# Patient Record
Sex: Male | Born: 1980 | Race: White | Hispanic: No | Marital: Single
Health system: Southern US, Community
[De-identification: ages and names within clinical notes are randomized; demographics above are authoritative.]

---

## 2003-10-27 ENCOUNTER — Emergency Department (HOSPITAL_COMMUNITY): Admission: EM | Admit: 2003-10-27 | Discharge: 2003-10-27 | Payer: Self-pay | Admitting: Emergency Medicine

## 2005-09-16 ENCOUNTER — Inpatient Hospital Stay (HOSPITAL_COMMUNITY): Admission: EM | Admit: 2005-09-16 | Discharge: 2005-09-18 | Payer: Self-pay | Admitting: Emergency Medicine

## 2018-02-28 ENCOUNTER — Ambulatory Visit (INDEPENDENT_AMBULATORY_CARE_PROVIDER_SITE_OTHER): Payer: BLUE CROSS/BLUE SHIELD | Admitting: Orthopaedic Surgery

## 2018-02-28 ENCOUNTER — Encounter (INDEPENDENT_AMBULATORY_CARE_PROVIDER_SITE_OTHER): Payer: Self-pay | Admitting: Physician Assistant

## 2018-02-28 ENCOUNTER — Other Ambulatory Visit (INDEPENDENT_AMBULATORY_CARE_PROVIDER_SITE_OTHER): Payer: Self-pay

## 2018-02-28 ENCOUNTER — Ambulatory Visit (INDEPENDENT_AMBULATORY_CARE_PROVIDER_SITE_OTHER): Payer: Self-pay

## 2018-02-28 DIAGNOSIS — M542 Cervicalgia: Secondary | ICD-10-CM

## 2018-02-28 DIAGNOSIS — R200 Anesthesia of skin: Secondary | ICD-10-CM | POA: Diagnosis not present

## 2018-02-28 MED ORDER — PREDNISONE 10 MG (21) PO TBPK: ORAL_TABLET | ORAL | 0 refills | Status: AC

## 2018-02-28 MED ORDER — METHOCARBAMOL 500 MG PO TABS: 500.0000 mg | ORAL_TABLET | Freq: Two times a day (BID) | ORAL | 0 refills | Status: AC | PRN

## 2018-02-28 NOTE — Progress Notes (Signed)
Office Visit Note   Patient: Eric Griffin           Date of Birth: October 14, 1980           MRN: [id number] Visit Date: 02/28/2018              Requested by: No referring provider defined for this encounter. PCP: Patient, No Pcp Per   Assessment & Plan: Visit Diagnoses:  1. Left arm numbness     Plan: Impression is herniated disc cervical spine.  Will refer the patient for an urgent MRI of his cervical spine.  I will go ahead and start him on a Sterapred taper and muscle relaxer.  Follow-up with [name] in 10 days or soon as the MRI has been completed.  Follow-Up Instructions: Return in about 10 days (around 03/10/2018) for mri review.   Orders:  Orders Placed This Encounter  Procedures  . XR Cervical Spine 2 or 3 views   Meds ordered this encounter  Medications  . predniSONE (STERAPRED UNI-PAK 21 TAB) 10 MG (21) TBPK tablet    Sig: Take as directed    Dispense:  21 tablet    Refill:  0  . methocarbamol (ROBAXIN) 500 MG tablet    Sig: Take 1 tablet (500 mg total) by mouth 2 (two) times daily as needed for muscle spasms.    Dispense:  20 tablet    Refill:  0      Procedures: No procedures performed   Clinical Data: No additional findings.   Subjective: Chief Complaint  Patient presents with  . 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  Approximately 2-1/2 weeks ago while lying in bed in a supine position, he pushed his head back and rotated to his shoulders to the right.  He felt a pop to the left side of his neck with a shock type pain radiating down from his neck to his hand.  He thinks this was significant enough to where he passed out.  Following 2 days he had significant pain which was mildly relieved with NSAIDs.  Today, he notes moderate improvement of pain.  The pain he does have is primarily to the elbow and into his forearm.  He does note tension to the left side of his  neck.  He also notes significant weakness to the triceps.  He has been using ice and Advil with mild relief of symptoms.  He notes numbness to the thumb and index finger.  No previous neck injury.  No lower extremity weakness, bowel or bladder change or saddle paresthesias.  No loss of vision, chest pain or shortness of breath.  Review of Systems as detailed in HPI.  All others reviewed and are negative.   Objective: Vital Signs: There were no vitals taken for this visit.  Physical Exam well-developed well-nourished gentleman no acute distress.  Alert and oriented x3.  Ortho Exam examination of cervical spine reveals moderate spinous tenderness to C6-C7.  He does have tenderness to the left parascapular trigger points.  Limited range of motion of the neck secondary to pain and stiffness.  Full strength with elbow flexion however he lacks strength with elbow extension.  He is able to make a full composite fist.  Full motor function of all 5 fingers.  Decreased sensation to the thumb and index finger.  Specialty Comments:  No specialty comments available.  Imaging: Xr  Cervical Spine 2 Or 3 Views  Result Date: 02/28/2018 No acute findings    PMFS History: Patient Active Problem List   Diagnosis Date Noted  . Left arm numbness 02/28/2018   History reviewed. No pertinent past medical history.  History reviewed. No pertinent family history.  History reviewed. No pertinent surgical history. Social History   Occupational History  . Not on file  Tobacco Use  . Smoking status: Not on file  Substance and Sexual Activity  . Alcohol use: Not on file  . Drug use: Not on file  . Sexual activity: Not on file

## 2018-03-03 ENCOUNTER — Ambulatory Visit (HOSPITAL_COMMUNITY)
Admission: RE | Admit: 2018-03-03 | Discharge: 2018-03-03 | Disposition: A | Discharge status: Home / Self Care | Payer: BLUE CROSS/BLUE SHIELD | Source: Ambulatory Visit | Attending: Physician Assistant | Admitting: Physician Assistant

## 2018-03-03 DIAGNOSIS — M47812 Spondylosis without myelopathy or radiculopathy, cervical region: Secondary | ICD-10-CM | POA: Diagnosis not present

## 2018-03-03 DIAGNOSIS — M542 Cervicalgia: Secondary | ICD-10-CM

## 2018-03-03 DIAGNOSIS — M4802 Spinal stenosis, cervical region: Secondary | ICD-10-CM | POA: Insufficient documentation

## 2018-03-03 DIAGNOSIS — R200 Anesthesia of skin: Secondary | ICD-10-CM | POA: Diagnosis present

## 2018-03-04 ENCOUNTER — Telehealth (INDEPENDENT_AMBULATORY_CARE_PROVIDER_SITE_OTHER): Payer: Self-pay

## 2018-03-04 NOTE — Progress Notes (Signed)
F/u to discuss

## 2018-03-04 NOTE — Telephone Encounter (Signed)
Called patient no answer LMOM to return our call to make appt to review MRI with Dr Roda ShuttersXu.

## 2018-03-11 ENCOUNTER — Ambulatory Visit (INDEPENDENT_AMBULATORY_CARE_PROVIDER_SITE_OTHER): Payer: BLUE CROSS/BLUE SHIELD | Admitting: Orthopaedic Surgery

## 2018-03-11 DIAGNOSIS — M542 Cervicalgia: Secondary | ICD-10-CM | POA: Insufficient documentation

## 2018-03-11 NOTE — Progress Notes (Signed)
      Patient: Eric Griffin           Date of Birth: 1981/02/22           MRN: [id number] Visit Date: 03/11/2018 PCP: Patient, No Pcp Per   Assessment & Plan:  Chief Complaint:  Chief Complaint  Patient presents with  . Neck - Pain   Visit Diagnoses:  1. Neck pain     Plan: Eric Griffin is a pleasant 37 year old gentleman who presents to our clinic today to discuss MRI results of the cervical spine.  MRI results reveal spondylosis and a small left foraminal disc herniation affecting the left C7 nerve.  This does correspond to the patient's symptoms.  the patient has had moderate relief with a Sterapred taper muscle relaxer but his pain has slowly started to return.  At this point, we will refer him to Dr. Alvester MorinNewton for an epidural steroid injection.  He will follow-up with us as needed.  Follow-Up Instructions: Return if symptoms worsen or fail to improve.   Orders:  Orders Placed This Encounter  Procedures  . Ambulatory referral to Physical Medicine Rehab   No orders of the defined types were placed in this encounter.   Imaging: No new imaging  PMFS History: Patient Active Problem List   Diagnosis Date Noted  . Neck pain 03/11/2018  . Left arm numbness 02/28/2018   No past medical history on file.  No family history on file.  No past surgical history on file. Social History   Occupational History  . Not on file  Tobacco Use  . Smoking status: Not on file  Substance and Sexual Activity  . Alcohol use: Not on file  . Drug use: Not on file  . Sexual activity: Not on file

## 2018-03-14 ENCOUNTER — Other Ambulatory Visit (INDEPENDENT_AMBULATORY_CARE_PROVIDER_SITE_OTHER): Payer: Self-pay | Admitting: Physical Medicine and Rehabilitation

## 2018-03-14 ENCOUNTER — Telehealth (INDEPENDENT_AMBULATORY_CARE_PROVIDER_SITE_OTHER): Payer: Self-pay | Admitting: *Deleted

## 2018-03-14 DIAGNOSIS — F411 Generalized anxiety disorder: Secondary | ICD-10-CM

## 2018-03-14 MED ORDER — TRIAZOLAM 0.25 MG PO TABS: ORAL_TABLET | ORAL | 0 refills | Status: DC

## 2018-03-18 ENCOUNTER — Other Ambulatory Visit (INDEPENDENT_AMBULATORY_CARE_PROVIDER_SITE_OTHER): Payer: Self-pay | Admitting: Physical Medicine and Rehabilitation

## 2018-03-18 DIAGNOSIS — F411 Generalized anxiety disorder: Secondary | ICD-10-CM

## 2018-03-18 MED ORDER — TRIAZOLAM 0.25 MG PO TABS: ORAL_TABLET | ORAL | 0 refills | Status: DC

## 2018-03-18 NOTE — Progress Notes (Signed)
Pre-procedure triazolam ordered for pre-operative anxiety.  

## 2018-03-18 NOTE — Telephone Encounter (Signed)
Sent triazolam

## 2018-03-18 NOTE — Telephone Encounter (Signed)
Called pt and advised on rx being sent to pharmacy.

## 2018-03-31 ENCOUNTER — Encounter (INDEPENDENT_AMBULATORY_CARE_PROVIDER_SITE_OTHER): Payer: Self-pay | Admitting: Physical Medicine and Rehabilitation

## 2018-04-28 ENCOUNTER — Ambulatory Visit (INDEPENDENT_AMBULATORY_CARE_PROVIDER_SITE_OTHER): Payer: BLUE CROSS/BLUE SHIELD | Admitting: Physical Medicine and Rehabilitation

## 2018-04-28 ENCOUNTER — Telehealth (INDEPENDENT_AMBULATORY_CARE_PROVIDER_SITE_OTHER): Payer: Self-pay | Admitting: *Deleted

## 2018-04-28 DIAGNOSIS — M5412 Radiculopathy, cervical region: Secondary | ICD-10-CM

## 2018-04-28 DIAGNOSIS — M542 Cervicalgia: Secondary | ICD-10-CM

## 2018-04-29 NOTE — Progress Notes (Signed)
Patient arrived late without required transportation for cervical epidural injection.  Patient was rescheduled and we will provide antianxiety medicine for the potential injection.

## 2018-05-09 ENCOUNTER — Other Ambulatory Visit (INDEPENDENT_AMBULATORY_CARE_PROVIDER_SITE_OTHER): Payer: Self-pay | Admitting: Physical Medicine and Rehabilitation

## 2018-05-09 MED ORDER — TRIAZOLAM 0.25 MG PO TABS: ORAL_TABLET | ORAL | 0 refills | Status: AC

## 2018-05-09 NOTE — Telephone Encounter (Signed)
Advised to pt.  

## 2018-05-09 NOTE — Telephone Encounter (Signed)
Done today.

## 2018-05-12 ENCOUNTER — Encounter (INDEPENDENT_AMBULATORY_CARE_PROVIDER_SITE_OTHER): Payer: Self-pay | Admitting: Physical Medicine and Rehabilitation

## 2018-05-13 ENCOUNTER — Telehealth (INDEPENDENT_AMBULATORY_CARE_PROVIDER_SITE_OTHER): Payer: Self-pay | Admitting: *Deleted

## 2018-05-13 NOTE — Telephone Encounter (Signed)
Called pt and lvm #1 to r/s appt.

## 2020-06-15 IMAGING — MR MR CERVICAL SPINE W/O CM
4 of 5 series · 25 of 48 positions shown · non-contrast
Comparison: Radiography 02/28/2018

CLINICAL DATA: Left arm pain and numbness.  Injured 3 weeks ago.

EXAM:
MRI CERVICAL SPINE WITHOUT CONTRAST
TECHNIQUE: Multiplanar, multisequence MR imaging of the cervical spine was
performed. No intravenous contrast was administered.

[Series 2: T2 · sagittal · 3.0mm · 0.43mm/px · 6 of 16 slices shown (1 of 2)]
[im 1/16]
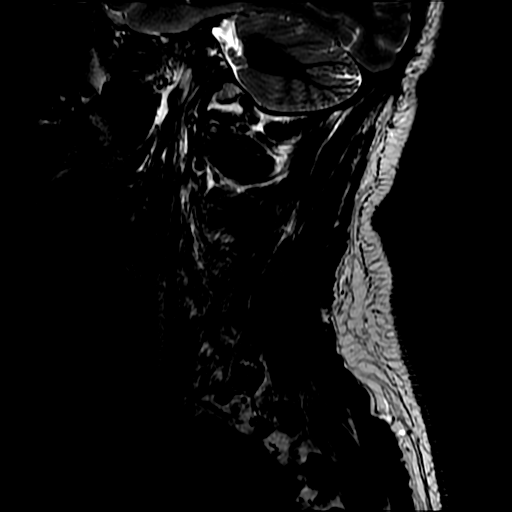
[im 4/16]
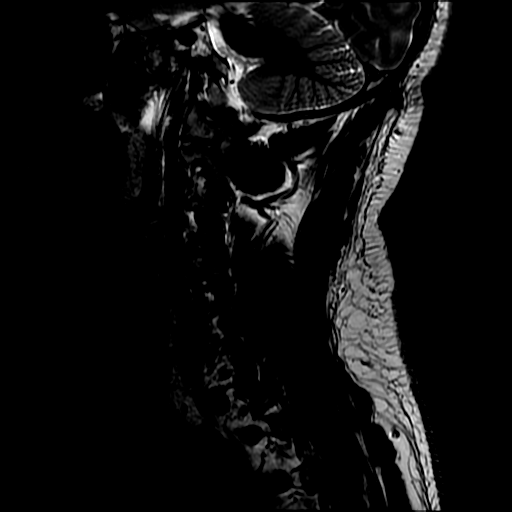
[im 7/16]
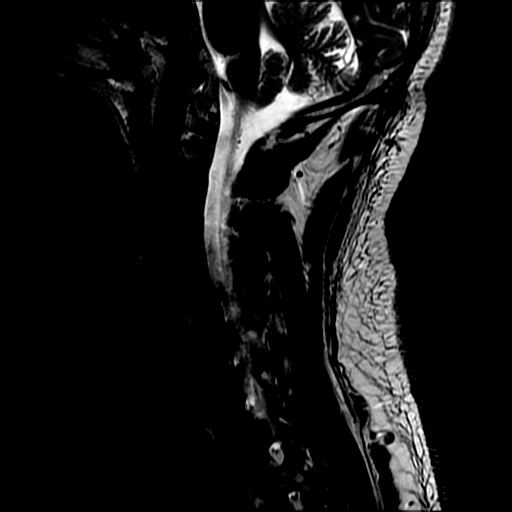
[im 10/16]
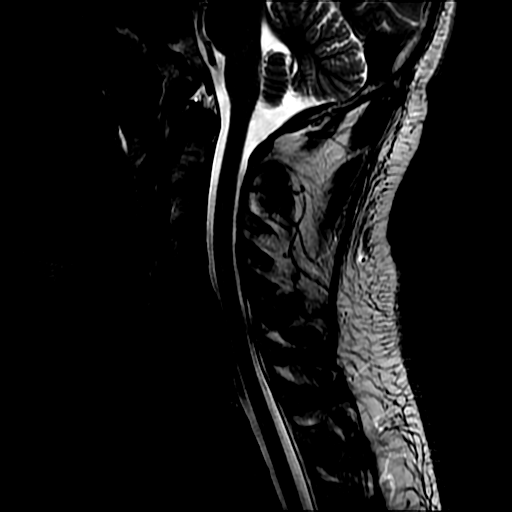
[im 13/16]
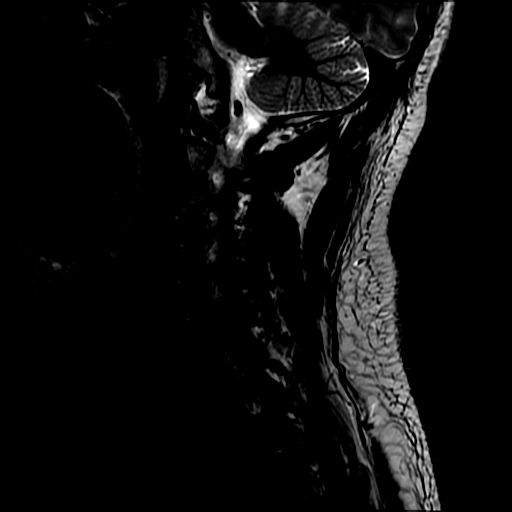
[im 16/16]
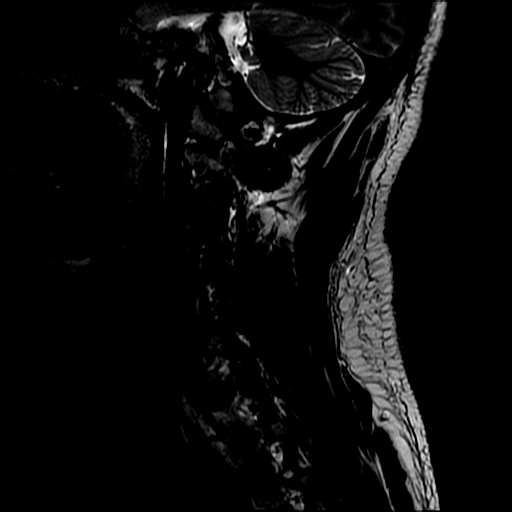

[Series 3: STIR · sagittal · 3.0mm · 0.43mm/px · 6 of 16 slices shown]
[im 1/16]
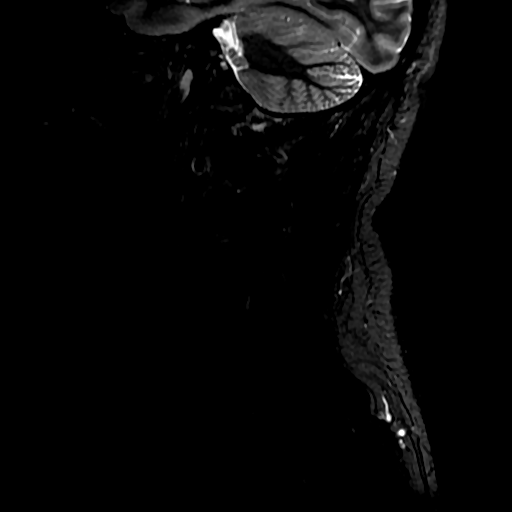
[im 4/16]
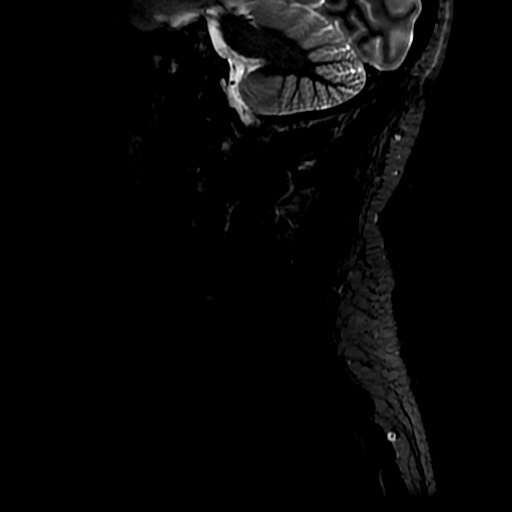
[im 7/16]
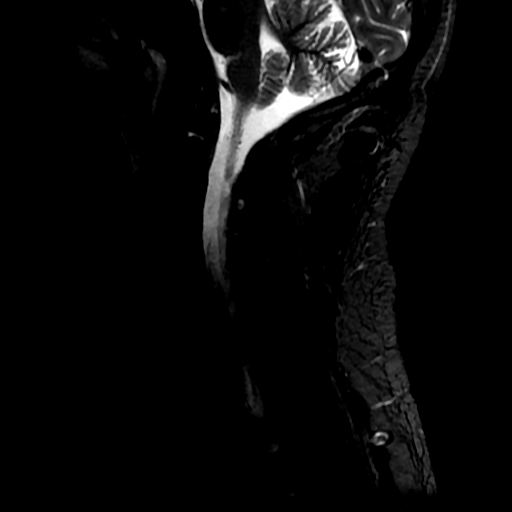
[im 10/16]
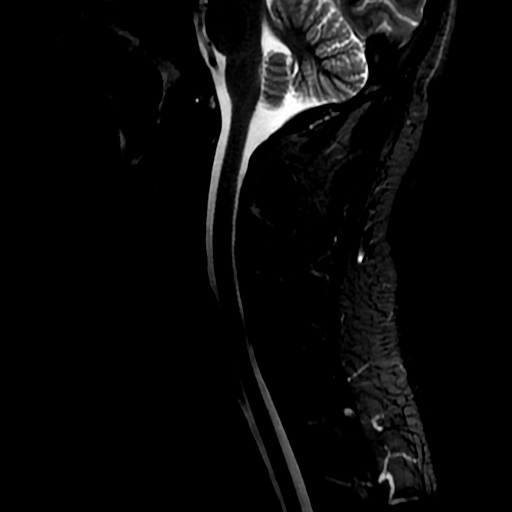
[im 13/16]
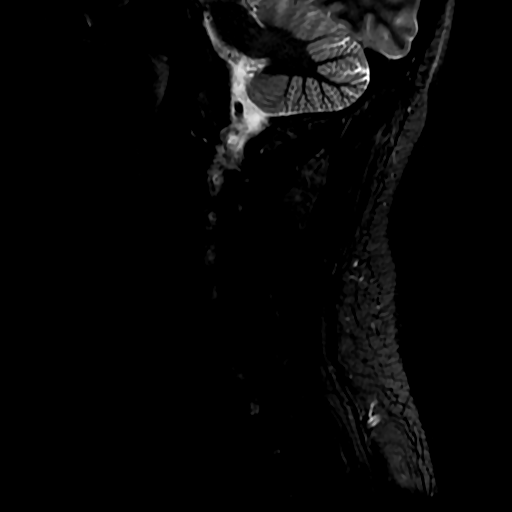
[im 16/16]
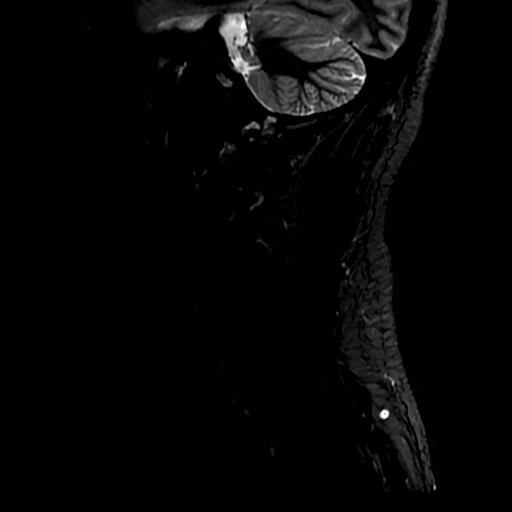

[Series 4: FLAIR · sagittal · 3.0mm · 0.43mm/px · 5 of 16 slices shown]
[im 1/16]
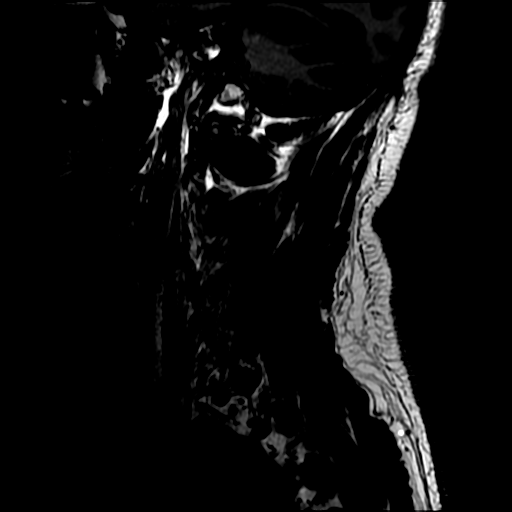
[im 4/16]
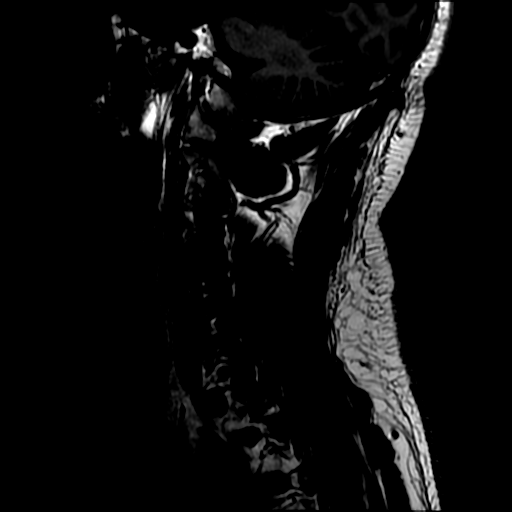
[im 7/16]
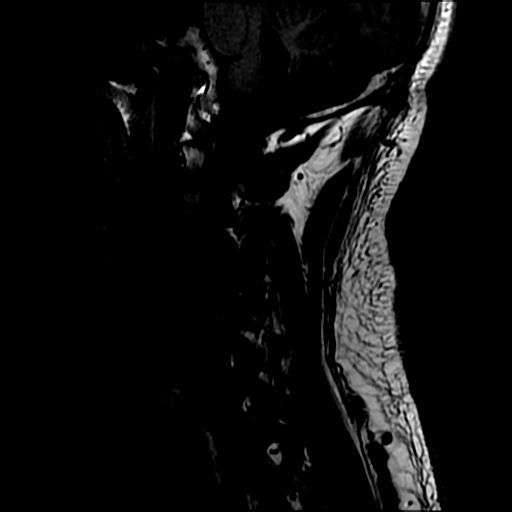
[im 10/16]
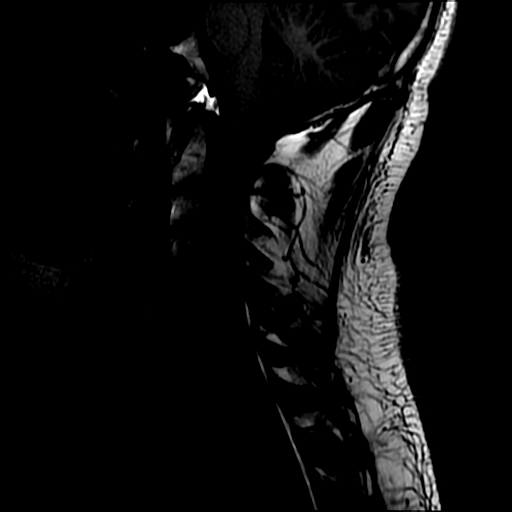
[im 16/16]
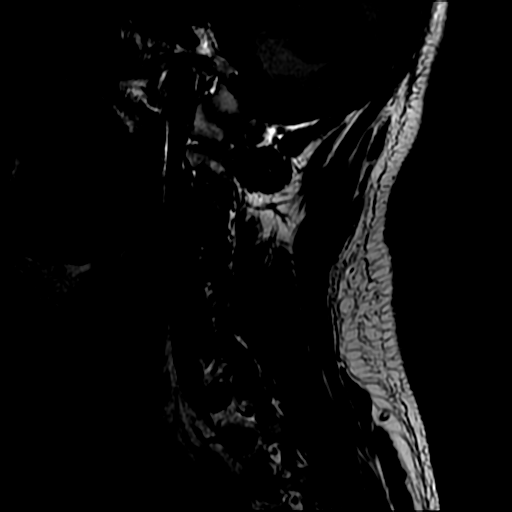

[Series 7: T2 · axial · 3.0mm · 0.70mm/px · z∈[-88,+24]mm · 8 of 36 slices shown (2 of 2)]
[im 1/36]
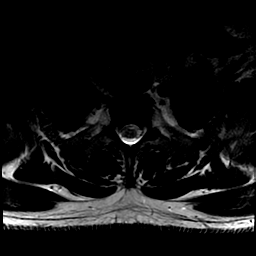
[im 6/36]
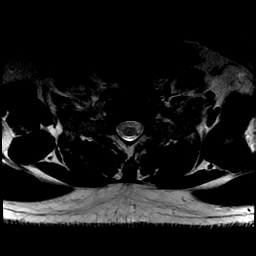
[im 11/36]
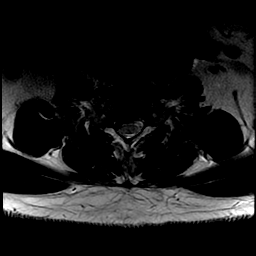
[im 17/36]
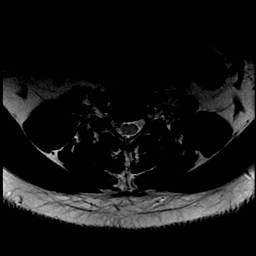
[im 19/36]
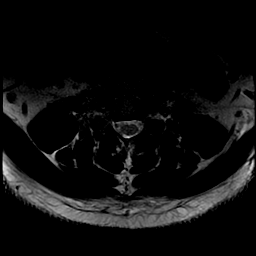
[im 25/36]
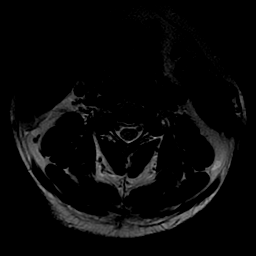
[im 30/36]
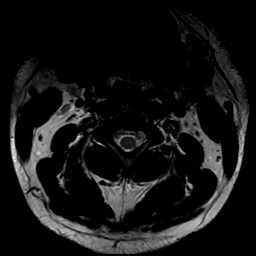
[im 36/36]
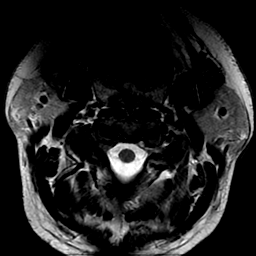

[25 of 48 positions shown; findings below may reference images not displayed]

FINDINGS: Alignment: Normal

Vertebrae: Normal

Cord: Normal

Posterior Fossa, vertebral arteries, paraspinal tissues: Normal

Disc levels:

No abnormality of the foramen magnum, C1-2, C2-3, C3-4 or C4-5.

C5-6: Right-sided predominant spondylosis with endplate osteophytes
and bulging of the disc. No compressive canal stenosis. Mild right
foraminal narrowing.

C6-7: Spondylosis with endplate osteophytes and bulging of the disc.
I think there is probably a small left foraminal disc herniation
that could affect the left C7 nerve.

C7-T1: Normal
IMPRESSION: In this patient with left-sided pain, at C6-7, there is spondylosis
with what I think is a small left foraminal disc herniation that
could focally affect the left C7 nerve.

Right-sided predominant spondylosis at C5-6 with mild right
foraminal narrowing.

## 2022-01-06 ENCOUNTER — Ambulatory Visit (HOSPITAL_COMMUNITY)
Admission: EM | Admit: 2022-01-06 | Discharge: 2022-01-06 | Disposition: A | Discharge status: Home / Self Care | Payer: BC Managed Care – PPO

## 2022-01-06 ENCOUNTER — Encounter (HOSPITAL_COMMUNITY): Payer: Self-pay

## 2022-01-06 ENCOUNTER — Encounter (HOSPITAL_COMMUNITY): Payer: Self-pay | Admitting: Emergency Medicine

## 2022-01-06 ENCOUNTER — Emergency Department (HOSPITAL_COMMUNITY): Payer: BC Managed Care – PPO

## 2022-01-06 ENCOUNTER — Emergency Department (HOSPITAL_COMMUNITY)
Admission: EM | Admit: 2022-01-06 | Discharge: 2022-01-07 | Disposition: A | Discharge status: Home / Self Care | Payer: BC Managed Care – PPO | Attending: Student | Admitting: Student

## 2022-01-06 ENCOUNTER — Other Ambulatory Visit: Payer: Self-pay

## 2022-01-06 ENCOUNTER — Ambulatory Visit (HOSPITAL_COMMUNITY): Payer: BC Managed Care – PPO

## 2022-01-06 DIAGNOSIS — R198 Other specified symptoms and signs involving the digestive system and abdomen: Secondary | ICD-10-CM

## 2022-01-06 DIAGNOSIS — T18108A Unspecified foreign body in esophagus causing other injury, initial encounter: Secondary | ICD-10-CM | POA: Diagnosis present

## 2022-01-06 DIAGNOSIS — X58XXXA Exposure to other specified factors, initial encounter: Secondary | ICD-10-CM | POA: Insufficient documentation

## 2022-01-06 LAB — CBC WITH DIFFERENTIAL/PLATELET
Abs Immature Granulocytes: 0.03 10*3/uL (ref 0.00–0.07)
Basophils Absolute: 0.1 10*3/uL (ref 0.0–0.1)
Basophils Relative: 1 %
Eosinophils Absolute: 0.4 10*3/uL (ref 0.0–0.5)
Eosinophils Relative: 4 %
HCT: 43.4 % (ref 39.0–52.0)
Hemoglobin: 14.8 g/dL (ref 13.0–17.0)
Immature Granulocytes: 0 %
Lymphocytes Relative: 17 %
Lymphs Abs: 1.9 10*3/uL (ref 0.7–4.0)
MCH: 30.7 pg (ref 26.0–34.0)
MCHC: 34.1 g/dL (ref 30.0–36.0)
MCV: 90 fL (ref 80.0–100.0)
Monocytes Absolute: 0.8 10*3/uL (ref 0.1–1.0)
Monocytes Relative: 7 %
Neutro Abs: 7.7 10*3/uL (ref 1.7–7.7)
Neutrophils Relative %: 71 %
Platelets: 346 10*3/uL (ref 150–400)
RBC: 4.82 MIL/uL (ref 4.22–5.81)
RDW: 12 % (ref 11.5–15.5)
WBC: 10.8 10*3/uL — ABNORMAL HIGH (ref 4.0–10.5)
nRBC: 0 % (ref 0.0–0.2)

## 2022-01-06 LAB — COMPREHENSIVE METABOLIC PANEL
ALT: 25 U/L (ref 0–44)
AST: 28 U/L (ref 15–41)
Albumin: 4.7 g/dL (ref 3.5–5.0)
Alkaline Phosphatase: 64 U/L (ref 38–126)
Anion gap: 10 (ref 5–15)
BUN: 11 mg/dL (ref 6–20)
CO2: 23 mmol/L (ref 22–32)
Calcium: 9.3 mg/dL (ref 8.9–10.3)
Chloride: 104 mmol/L (ref 98–111)
Creatinine, Ser: 1.03 mg/dL (ref 0.61–1.24)
GFR, Estimated: >60 mL/min (ref >60)
Glucose, Bld: 105 mg/dL — ABNORMAL HIGH (ref 70–99)
Potassium: 4 mmol/L (ref 3.5–5.1)
Sodium: 137 mmol/L (ref 135–145)
Total Bilirubin: 0.8 mg/dL (ref 0.3–1.2)
Total Protein: 8 g/dL (ref 6.5–8.1)

## 2022-01-06 NOTE — ED Provider Triage Note (Signed)
Emergency Medicine Provider Triage Evaluation Note  Eric Griffin , a 41 y.o. male  was evaluated in triage.  Pt complains of food stuck in his esophagus.  He was eating barbecue chicken from surgical care at approximately 1:30 PM when he had a piece of dry chicken that got stuck in his throat.  Was immediately unable to tolerate more food.  Has been puking up since.  No shortness of breath, difficulty speaking, chest pain, hematemesis  Review of Systems  Positive: As above Negative: As above  Physical Exam  BP (!) 158/104 (BP Location: Right Arm)   Pulse (!) 117   Temp 99.4 F (37.4 C) (Oral)   Resp 18   SpO2 100%  Gen:   Awake, no distress   Resp:  Normal effort  MSK:   Moves extremities without difficulty  Other:  Speaking in full clear sentences, observed patient try to drink Coke but spit it back up  Medical Decision Making  Medically screening exam initiated at 4:51 PM.  Appropriate orders placed.  Eric Griffin was informed that the remainder of the evaluation will be completed by another provider, this initial triage assessment does not replace that evaluation, and the importance of remaining in the ED until their evaluation is complete.  We will obtain basic labs and a chest x-ray   Eric Griffin, Eric Griffin 01/06/22 1656

## 2022-01-06 NOTE — ED Triage Notes (Signed)
Pt reports something stuck in his esophagus. Last meal was 30 minutes ago. Pt reports he is having a hard time keep anything on his stomach.  Inform provider of patient complaint.

## 2022-01-06 NOTE — ED Provider Notes (Addendum)
Patient presents with sensation of food being stuck in the esophagus.  Endorses that every time he swallows he feels fluid getting caught and he is unable to tolerate even water.  Endorses increase in saliva and constant spitting up and vomiting.  Symptoms started at approximately 130 today while eating lunch, endorses that he was eating a barbecue chicken sandwich and the chicken was quite dry.  Endorses that he is breathing okay and denies shortness of breath and difficulty swallowing.  discussed with patient that we are capable of completing a chest x-ray here today in the urgent care setting but if object is lodged within the esophagus then he will need to go to the emergency department for further evaluation.  Discussed that it is possible that there is just a sensation due to irritation but patient is very adamant that he knows something is lodged within his body and therefore will go to the nearest emergency department for further evaluation and management.    Hans Eden, NP 01/06/22 1627    Hans Eden, NP 01/06/22 [phone]

## 2022-01-06 NOTE — ED Triage Notes (Signed)
Patient states he feels like food is stuck in his throat after taking a bite of a BBQ sandwich today at approximately 1330 today. Patient states he has tried inducing vomiting and tried drinking liquids but the sensation of something stuck in this throat does not go away.

## 2022-01-07 NOTE — ED Provider Notes (Signed)
[facility] EMERGENCY DEPARTMENT Provider Note   CSN: [id number] Arrival date & time: 01/06/22  1625     History  No chief complaint on file.   Eric Griffin is a 41 y.o. male.  41 year old male presents to the emergency department for evaluation of esophageal foreign body.  He was eating barbecue chicken for lunch at work at 1330 when a piece of chicken got stuck in his throat.  He tried drinking water, tea, soda for relief without improvement.  Reports vomiting when attempting to drink p.o. fluids.  Was unable to tolerate oral secretions.  No shortness of breath, difficulty speaking, chest pain, hematemesis.  Patient has been in the waiting room for approximately 12 hours.  He reports taking a nap on his wife's shoulder and waking to note his symptoms had spontaneously resolved.  He is presently asymptomatic.  He subsequently drank a whole bottle of water in the waiting room.  The history is provided by the patient and the spouse. No language interpreter was used.       Home Medications Prior to Admission medications   Medication Sig Start Date End Date Taking? Authorizing Provider  methocarbamol (ROBAXIN) 500 MG tablet Take 1 tablet (500 mg total) by mouth 2 (two) times daily as needed for muscle spasms. 02/28/18   Aundra Dubin, PA-C  predniSONE (STERAPRED UNI-PAK 21 TAB) 10 MG (21) TBPK tablet Take as directed 02/28/18   Aundra Dubin, PA-C  triazolam (HALCION) 0.25 MG tablet Take 1 tab by mouth 1 hour prior to procedure with very light food, do not drive motor vehicle. 05/09/18   Magnus Sinning, MD      Allergies    Patient has no known allergies.    Review of Systems   Review of Systems Ten systems reviewed and are negative for acute change, except as noted in the HPI.    Physical Exam Updated Vital Signs BP (!) 135/94 (BP Location: Right Arm)   Pulse 92   Temp 98 F (36.7 C) (Oral)   Resp 16   SpO2 98%  Physical Exam Vitals and  nursing note reviewed.  Constitutional:      General: He is not in acute distress.    Appearance: He is well-developed. He is not diaphoretic.     Comments: Nontoxic appearing and in NAD  HENT:     Head: Normocephalic and atraumatic.     Mouth/Throat:     Comments: Tolerating secretions w/o difficulty. No tripoding or stridor. Normal phonation. Eyes:     General: No scleral icterus.    Conjunctiva/sclera: Conjunctivae normal.  Cardiovascular:     Rate and Rhythm: Normal rate and regular rhythm.     Pulses: Normal pulses.     Comments: Not tachycardic as noted in triage. Pulmonary:     Effort: Pulmonary effort is normal. No respiratory distress.     Comments: Respirations even and unlabored Musculoskeletal:        General: Normal range of motion.     Cervical back: Normal range of motion.  Skin:    General: Skin is warm and dry.     Coloration: Skin is not pale.     Findings: No erythema or rash.  Neurological:     Mental Status: He is alert and oriented to person, place, and time.  Psychiatric:        Behavior: Behavior normal.     ED Results / Procedures / Treatments   Labs (all labs ordered are  listed, but only abnormal results are displayed) Labs Reviewed  COMPREHENSIVE METABOLIC PANEL - Abnormal; Notable for the following components:      Result Value   Glucose, Bld 105 (*)    All other components within normal limits  CBC WITH DIFFERENTIAL/PLATELET - Abnormal; Notable for the following components:   WBC 10.8 (*)    All other components within normal limits    EKG None  Radiology DG Chest 2 View  Result Date: 01/06/2022 CLINICAL DATA:  Short of breath, possible food impaction in esophagus EXAM: CHEST - 2 VIEW COMPARISON:  09/16/2005 FINDINGS: Frontal and lateral views of the chest demonstrate an unremarkable cardiac silhouette. No airspace disease, effusion, or pneumothorax. No radiopaque foreign bodies. No acute bony abnormalities. IMPRESSION: 1. No acute  intrathoracic process. Electronically Signed   By: Sharlet Salina M.D.   On: 01/06/2022 17:41    Procedures Procedures    Medications Ordered in ED Medications - No data to display  ED Course/ Medical Decision Making/ A&P                           Medical Decision Making  This patient presents to the ED for concern of esophageal food impaction, this involves an extensive number of treatment options, and is a complaint that carries with it a high risk of complications and morbidity.  The differential diagnosis includes uncomplicated food impaction vs Boerhaave's   Co morbidities that complicate the patient evaluation  None    Additional history obtained:  Additional history obtained from wife External records from outside source obtained and reviewed including PCP visit from 10/2021   Lab Tests:  I Ordered, and personally interpreted labs.  The pertinent results include:  WBC of 10.8, normal CMP   Imaging Studies ordered:  I ordered imaging studies including CXR  I independently visualized and interpreted imaging which showed no acute cardiopulmonary abnormality. No free air. I agree with the radiologist interpretation   Medicines ordered and prescription drug management:  I have reviewed the patients home medicines and have made adjustments as needed   Reevaluation:  After the interventions noted above, I reevaluated the patient and found that they have : resolved   Social Determinants of Health:  Insured patient   Dispostion:  After consideration of the diagnostic results and the patients response to treatment, I feel that the patent would benefit from outpatient GI follow up given potential for stricture contributing to esophageal food impaction. Counseled on adequate chewing while eating. Return precautions discussed and provided. Patient discharged in stable condition with no unaddressed concerns.          Final Clinical Impression(s) / ED  Diagnoses Final diagnoses:  Esophageal foreign body, initial encounter    Rx / DC Orders ED Discharge Orders          Ordered    Ambulatory referral to Gastroenterology       Comments: Esophageal FB; now passed   01/07/22 0437              Antony Madura, PA-C 01/07/22 0450    Glendora Score, MD 01/10/22 1551

## 2022-01-07 NOTE — ED Notes (Signed)
Patient states since he is unsure if insurance will cover the visit he will stay and be seen

## 2022-01-07 NOTE — ED Notes (Signed)
Patient states the food is no longer stuck and he is leaving now

## 2022-01-07 NOTE — Discharge Instructions (Addendum)
We recommend GI follow up as you may benefit from endoscopy to evaluate for stricture given your history of prior retained esophageal foreign body. Be sure to chew food adequately prior to swallowing. Return for new or concerning symptoms.

## 2022-05-09 ENCOUNTER — Encounter: Payer: Self-pay | Admitting: Emergency Medicine
